# Patient Record
Sex: Male | Born: 2000 | Race: White | Hispanic: No | Marital: Single | Smoking: Never smoker
Health system: Southern US, Community
[De-identification: ages and names within clinical notes are randomized; demographics above are authoritative.]

## PROBLEM LIST (undated history)

## (undated) HISTORY — PX: URETHRAL STRICTURE DILATATION: SHX477

---
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
## 2021-03-25 IMAGING — CR DG FINGER LITTLE 2+V*L*
1 series · 3 of 3 positions shown · non-contrast
Comparison: None.

CLINICAL DATA: Smash injury to little finger with laceration.
Initial encounter.

EXAM:
LEFT LITTLE FINGER 2+V

[Series 1: dg finger little left · 0.14mm/px · 3 of 3 slices shown]
[im 1/3]
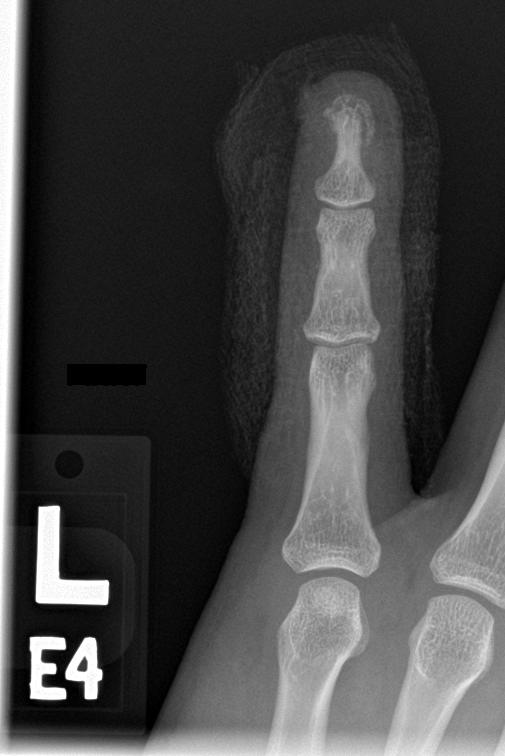
[im 2/3]
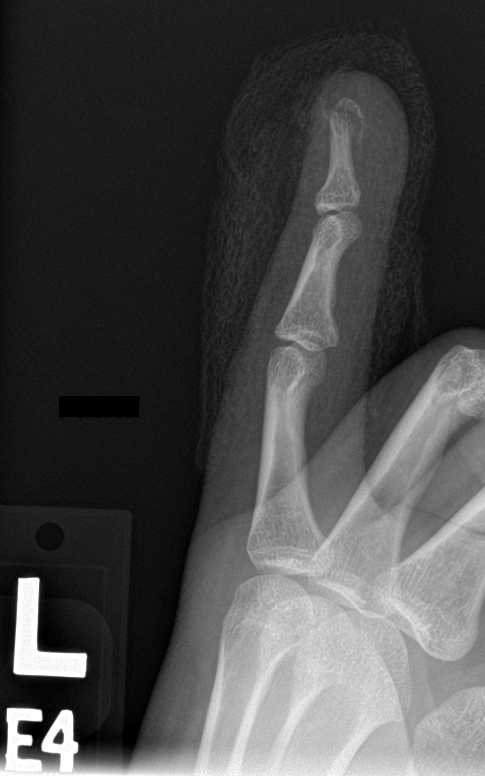
[im 3/3]
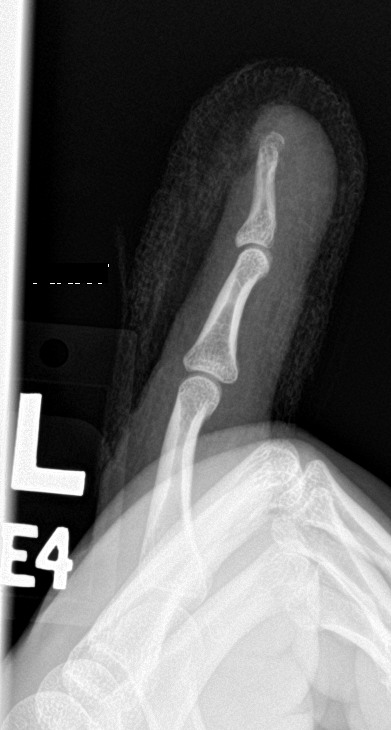

[3 of 3 positions shown; findings below may reference images not displayed]

FINDINGS: A mildly displaced fracture is seen involving the terminal tuft of
the distal phalanx. No other fractures identified. No evidence of
dislocation. No evidence of radiopaque foreign body.
IMPRESSION: Mildly displaced fracture involving the terminal tuft of the distal
phalanx.

## 2021-06-28 ENCOUNTER — Other Ambulatory Visit: Payer: Self-pay

## 2021-06-28 ENCOUNTER — Encounter: Payer: Self-pay | Admitting: Emergency Medicine

## 2021-06-28 ENCOUNTER — Emergency Department
Admission: EM | Admit: 2021-06-28 | Discharge: 2021-06-28 | Disposition: A | Discharge status: Home / Self Care | Payer: Medicaid Other | Attending: Emergency Medicine | Admitting: Emergency Medicine

## 2021-06-28 DIAGNOSIS — M5431 Sciatica, right side: Secondary | ICD-10-CM | POA: Diagnosis not present

## 2021-06-28 DIAGNOSIS — M549 Dorsalgia, unspecified: Secondary | ICD-10-CM | POA: Diagnosis present

## 2021-06-28 MED ORDER — NAPROXEN 500 MG PO TABS: 500.0000 mg | ORAL_TABLET | Freq: Two times a day (BID) | ORAL | 2 refills | Status: AC

## 2021-06-28 MED ORDER — PREDNISONE 50 MG PO TABS: 50.0000 mg | ORAL_TABLET | Freq: Every day | ORAL | 0 refills | Status: AC

## 2021-06-28 NOTE — ED Triage Notes (Signed)
Says he fell on bottom few weeks ago.  Went to [employer].  Says no fx, buttailbone is bent.   Has had pain in both legs/feet/certain toes since then.  No mostly right toes.

## 2021-06-28 NOTE — ED Provider Notes (Signed)
   [facility] Provider Note    Event Date/Time   First MD Initiated Contact with Patient 06/28/21 1032     (approximate)   History   Back pain   HPI  Carl Peterson is a 21 y.o. male who presents with complaints of back pain.  He has radiation down his right leg sometimes all the way to his foot.  He notes this has been ongoing for 2 days but he has had in the past as well.  No fevers or chills.  No loss of bowel or bladder continence, no saddle anesthesia.  No IV drug     Physical Exam   Triage Vital Signs: ED Triage Vitals  Enc Vitals Group     BP 06/28/21 0911 135/85     Pulse Rate 06/28/21 0911 92     Resp 06/28/21 0911 20     Temp 06/28/21 0911 98.9 F (37.2 C)     Temp Source 06/28/21 0911 Oral     SpO2 06/28/21 0911 96 %     Weight 06/28/21 0911 111.6 kg (246 lb)     Height 06/28/21 0911 1.727 m (5\' 8" )     Head Circumference --      Peak Flow --      Pain Score 06/28/21 0915 6     Pain Loc --      Pain Edu? --      Excl. in GC? --     Most recent vital signs: Vitals:   06/28/21 0911  BP: 135/85  Pulse: 92  Resp: 20  Temp: 98.9 F (37.2 C)  SpO2: 96%     General: Awake, no distress.  CV:  Good peripheral perfusion.  Resp:  Normal effort.  Abd:  No distention.  Other:  No vertebral tenderness palpation, normal strength in lower extremities, warm and well-perfused   ED Results / Procedures / Treatments   Labs (all labs ordered are listed, but only abnormal results are displayed) Labs Reviewed - No data to display   EKG     RADIOLOGY     PROCEDURES:  Critical Care performed:   Procedures   MEDICATIONS ORDERED IN ED: Medications - No data to display   IMPRESSION / MDM / ASSESSMENT AND PLAN / ED COURSE  I reviewed the triage vital signs and the nursing notes. Patient's presentation is most consistent with acute, uncomplicated illness.  Patient presents with back pain as detailed  above  Differential includes sciatica, lumbar strain  Exam is most consistent with sciatica.  Will treat with NSAIDs and prednisone burst, outpatient follow-up with Ortho as needed        FINAL CLINICAL IMPRESSION(S) / ED DIAGNOSES   Final diagnoses:  Sciatica of right side     Rx / DC Orders   ED Discharge Orders          Ordered    predniSONE (DELTASONE) 50 MG tablet  Daily with breakfast        06/28/21 1056    naproxen (NAPROSYN) 500 MG tablet  2 times daily with meals        06/28/21 1056             Note:  This document was prepared using Dragon voice recognition software and may include unintentional dictation errors.   06/30/21, MD 06/28/21 1547

## 2021-06-28 NOTE — ED Notes (Signed)
Dc ppw provided to patient. quetions, followup and rx information reviewed. Pt provides verbal consent for dc at this time and is alert and oriented x4. ambulatory to lobby.
